# Patient Record
Sex: Female | Born: 1970 | Race: White | Hispanic: No | Marital: Married | Smoking: Never smoker
Health system: Southern US, Community
[De-identification: ages and names within clinical notes are randomized; demographics above are authoritative.]

## PROBLEM LIST (undated history)

## (undated) DIAGNOSIS — Z87442 Personal history of urinary calculi: Secondary | ICD-10-CM

## (undated) HISTORY — PX: NO PAST SURGERIES: SHX2092

---

## 1998-11-06 ENCOUNTER — Other Ambulatory Visit: Admission: RE | Admit: 1998-11-06 | Discharge: 1998-11-06 | Payer: Self-pay | Admitting: *Deleted

## 1999-11-08 ENCOUNTER — Other Ambulatory Visit: Admission: RE | Admit: 1999-11-08 | Discharge: 1999-11-08 | Payer: Self-pay | Admitting: *Deleted

## 2001-03-11 ENCOUNTER — Other Ambulatory Visit: Admission: RE | Admit: 2001-03-11 | Discharge: 2001-03-11 | Payer: Self-pay | Admitting: Obstetrics and Gynecology

## 2002-03-19 ENCOUNTER — Other Ambulatory Visit: Admission: RE | Admit: 2002-03-19 | Discharge: 2002-03-19 | Payer: Self-pay | Admitting: Obstetrics and Gynecology

## 2003-03-31 ENCOUNTER — Other Ambulatory Visit: Admission: RE | Admit: 2003-03-31 | Discharge: 2003-03-31 | Payer: Self-pay | Admitting: Obstetrics and Gynecology

## 2004-07-23 ENCOUNTER — Other Ambulatory Visit: Admission: RE | Admit: 2004-07-23 | Discharge: 2004-07-23 | Payer: Self-pay | Admitting: Obstetrics and Gynecology

## 2016-06-26 DIAGNOSIS — M9901 Segmental and somatic dysfunction of cervical region: Secondary | ICD-10-CM | POA: Diagnosis not present

## 2016-06-26 DIAGNOSIS — M9903 Segmental and somatic dysfunction of lumbar region: Secondary | ICD-10-CM | POA: Diagnosis not present

## 2016-06-26 DIAGNOSIS — M9902 Segmental and somatic dysfunction of thoracic region: Secondary | ICD-10-CM | POA: Diagnosis not present

## 2016-07-16 DIAGNOSIS — M9903 Segmental and somatic dysfunction of lumbar region: Secondary | ICD-10-CM | POA: Diagnosis not present

## 2016-07-16 DIAGNOSIS — M9901 Segmental and somatic dysfunction of cervical region: Secondary | ICD-10-CM | POA: Diagnosis not present

## 2016-07-16 DIAGNOSIS — M9902 Segmental and somatic dysfunction of thoracic region: Secondary | ICD-10-CM | POA: Diagnosis not present

## 2016-08-02 DIAGNOSIS — A084 Viral intestinal infection, unspecified: Secondary | ICD-10-CM | POA: Diagnosis not present

## 2016-08-13 DIAGNOSIS — M9903 Segmental and somatic dysfunction of lumbar region: Secondary | ICD-10-CM | POA: Diagnosis not present

## 2016-08-13 DIAGNOSIS — M9901 Segmental and somatic dysfunction of cervical region: Secondary | ICD-10-CM | POA: Diagnosis not present

## 2016-08-13 DIAGNOSIS — M9902 Segmental and somatic dysfunction of thoracic region: Secondary | ICD-10-CM | POA: Diagnosis not present

## 2016-08-30 DIAGNOSIS — M9901 Segmental and somatic dysfunction of cervical region: Secondary | ICD-10-CM | POA: Diagnosis not present

## 2016-08-30 DIAGNOSIS — M9902 Segmental and somatic dysfunction of thoracic region: Secondary | ICD-10-CM | POA: Diagnosis not present

## 2016-08-30 DIAGNOSIS — M9903 Segmental and somatic dysfunction of lumbar region: Secondary | ICD-10-CM | POA: Diagnosis not present

## 2016-09-13 DIAGNOSIS — M9902 Segmental and somatic dysfunction of thoracic region: Secondary | ICD-10-CM | POA: Diagnosis not present

## 2016-09-13 DIAGNOSIS — M9903 Segmental and somatic dysfunction of lumbar region: Secondary | ICD-10-CM | POA: Diagnosis not present

## 2016-09-13 DIAGNOSIS — M9901 Segmental and somatic dysfunction of cervical region: Secondary | ICD-10-CM | POA: Diagnosis not present

## 2016-09-27 DIAGNOSIS — M9902 Segmental and somatic dysfunction of thoracic region: Secondary | ICD-10-CM | POA: Diagnosis not present

## 2016-09-27 DIAGNOSIS — M9903 Segmental and somatic dysfunction of lumbar region: Secondary | ICD-10-CM | POA: Diagnosis not present

## 2016-09-27 DIAGNOSIS — M9901 Segmental and somatic dysfunction of cervical region: Secondary | ICD-10-CM | POA: Diagnosis not present

## 2017-02-01 DIAGNOSIS — H00015 Hordeolum externum left lower eyelid: Secondary | ICD-10-CM | POA: Diagnosis not present

## 2017-02-12 DIAGNOSIS — H0015 Chalazion left lower eyelid: Secondary | ICD-10-CM | POA: Diagnosis not present

## 2017-02-13 DIAGNOSIS — M9902 Segmental and somatic dysfunction of thoracic region: Secondary | ICD-10-CM | POA: Diagnosis not present

## 2017-02-13 DIAGNOSIS — M9903 Segmental and somatic dysfunction of lumbar region: Secondary | ICD-10-CM | POA: Diagnosis not present

## 2017-02-13 DIAGNOSIS — M9901 Segmental and somatic dysfunction of cervical region: Secondary | ICD-10-CM | POA: Diagnosis not present

## 2017-02-19 DIAGNOSIS — M9901 Segmental and somatic dysfunction of cervical region: Secondary | ICD-10-CM | POA: Diagnosis not present

## 2017-02-19 DIAGNOSIS — M9902 Segmental and somatic dysfunction of thoracic region: Secondary | ICD-10-CM | POA: Diagnosis not present

## 2017-02-19 DIAGNOSIS — M9903 Segmental and somatic dysfunction of lumbar region: Secondary | ICD-10-CM | POA: Diagnosis not present

## 2017-02-26 DIAGNOSIS — M9901 Segmental and somatic dysfunction of cervical region: Secondary | ICD-10-CM | POA: Diagnosis not present

## 2017-02-26 DIAGNOSIS — M9902 Segmental and somatic dysfunction of thoracic region: Secondary | ICD-10-CM | POA: Diagnosis not present

## 2017-02-26 DIAGNOSIS — M9903 Segmental and somatic dysfunction of lumbar region: Secondary | ICD-10-CM | POA: Diagnosis not present

## 2017-03-05 DIAGNOSIS — M9902 Segmental and somatic dysfunction of thoracic region: Secondary | ICD-10-CM | POA: Diagnosis not present

## 2017-03-05 DIAGNOSIS — M9903 Segmental and somatic dysfunction of lumbar region: Secondary | ICD-10-CM | POA: Diagnosis not present

## 2017-03-05 DIAGNOSIS — M9901 Segmental and somatic dysfunction of cervical region: Secondary | ICD-10-CM | POA: Diagnosis not present

## 2017-03-18 DIAGNOSIS — M9901 Segmental and somatic dysfunction of cervical region: Secondary | ICD-10-CM | POA: Diagnosis not present

## 2017-03-18 DIAGNOSIS — M9902 Segmental and somatic dysfunction of thoracic region: Secondary | ICD-10-CM | POA: Diagnosis not present

## 2017-03-18 DIAGNOSIS — M9903 Segmental and somatic dysfunction of lumbar region: Secondary | ICD-10-CM | POA: Diagnosis not present

## 2017-03-24 DIAGNOSIS — M7989 Other specified soft tissue disorders: Secondary | ICD-10-CM | POA: Diagnosis not present

## 2017-03-24 DIAGNOSIS — M79671 Pain in right foot: Secondary | ICD-10-CM | POA: Diagnosis not present

## 2017-04-02 DIAGNOSIS — M9903 Segmental and somatic dysfunction of lumbar region: Secondary | ICD-10-CM | POA: Diagnosis not present

## 2017-04-02 DIAGNOSIS — M9902 Segmental and somatic dysfunction of thoracic region: Secondary | ICD-10-CM | POA: Diagnosis not present

## 2017-04-02 DIAGNOSIS — M9901 Segmental and somatic dysfunction of cervical region: Secondary | ICD-10-CM | POA: Diagnosis not present

## 2017-04-17 DIAGNOSIS — M9901 Segmental and somatic dysfunction of cervical region: Secondary | ICD-10-CM | POA: Diagnosis not present

## 2017-04-17 DIAGNOSIS — M9902 Segmental and somatic dysfunction of thoracic region: Secondary | ICD-10-CM | POA: Diagnosis not present

## 2017-04-17 DIAGNOSIS — M9903 Segmental and somatic dysfunction of lumbar region: Secondary | ICD-10-CM | POA: Diagnosis not present

## 2017-05-01 DIAGNOSIS — M9903 Segmental and somatic dysfunction of lumbar region: Secondary | ICD-10-CM | POA: Diagnosis not present

## 2017-05-01 DIAGNOSIS — M9902 Segmental and somatic dysfunction of thoracic region: Secondary | ICD-10-CM | POA: Diagnosis not present

## 2017-05-01 DIAGNOSIS — M9901 Segmental and somatic dysfunction of cervical region: Secondary | ICD-10-CM | POA: Diagnosis not present

## 2017-05-22 DIAGNOSIS — M9901 Segmental and somatic dysfunction of cervical region: Secondary | ICD-10-CM | POA: Diagnosis not present

## 2017-05-22 DIAGNOSIS — M9903 Segmental and somatic dysfunction of lumbar region: Secondary | ICD-10-CM | POA: Diagnosis not present

## 2017-05-22 DIAGNOSIS — M9902 Segmental and somatic dysfunction of thoracic region: Secondary | ICD-10-CM | POA: Diagnosis not present

## 2017-05-31 DIAGNOSIS — R42 Dizziness and giddiness: Secondary | ICD-10-CM | POA: Diagnosis not present

## 2017-05-31 DIAGNOSIS — R600 Localized edema: Secondary | ICD-10-CM | POA: Diagnosis not present

## 2017-06-11 DIAGNOSIS — M9901 Segmental and somatic dysfunction of cervical region: Secondary | ICD-10-CM | POA: Diagnosis not present

## 2017-06-11 DIAGNOSIS — M9903 Segmental and somatic dysfunction of lumbar region: Secondary | ICD-10-CM | POA: Diagnosis not present

## 2017-06-11 DIAGNOSIS — M9902 Segmental and somatic dysfunction of thoracic region: Secondary | ICD-10-CM | POA: Diagnosis not present

## 2017-06-19 DIAGNOSIS — Z01419 Encounter for gynecological examination (general) (routine) without abnormal findings: Secondary | ICD-10-CM | POA: Diagnosis not present

## 2017-06-25 ENCOUNTER — Other Ambulatory Visit: Payer: Self-pay | Admitting: Obstetrics and Gynecology

## 2017-06-25 DIAGNOSIS — R928 Other abnormal and inconclusive findings on diagnostic imaging of breast: Secondary | ICD-10-CM

## 2017-06-30 ENCOUNTER — Ambulatory Visit
Admission: RE | Admit: 2017-06-30 | Discharge: 2017-06-30 | Disposition: A | Discharge status: Home / Self Care | Payer: 59 | Source: Ambulatory Visit | Attending: Obstetrics and Gynecology | Admitting: Obstetrics and Gynecology

## 2017-06-30 DIAGNOSIS — R928 Other abnormal and inconclusive findings on diagnostic imaging of breast: Secondary | ICD-10-CM

## 2017-06-30 DIAGNOSIS — N6321 Unspecified lump in the left breast, upper outer quadrant: Secondary | ICD-10-CM | POA: Diagnosis not present

## 2017-07-05 DIAGNOSIS — N390 Urinary tract infection, site not specified: Secondary | ICD-10-CM | POA: Diagnosis not present

## 2017-08-12 DIAGNOSIS — J069 Acute upper respiratory infection, unspecified: Secondary | ICD-10-CM | POA: Diagnosis not present

## 2018-03-16 DIAGNOSIS — Z23 Encounter for immunization: Secondary | ICD-10-CM | POA: Diagnosis not present

## 2018-03-20 DIAGNOSIS — J019 Acute sinusitis, unspecified: Secondary | ICD-10-CM | POA: Diagnosis not present

## 2018-03-20 DIAGNOSIS — Z6833 Body mass index (BMI) 33.0-33.9, adult: Secondary | ICD-10-CM | POA: Diagnosis not present

## 2018-03-20 DIAGNOSIS — J208 Acute bronchitis due to other specified organisms: Secondary | ICD-10-CM | POA: Diagnosis not present

## 2018-07-07 DIAGNOSIS — Z803 Family history of malignant neoplasm of breast: Secondary | ICD-10-CM | POA: Diagnosis not present

## 2018-07-07 DIAGNOSIS — Z6835 Body mass index (BMI) 35.0-35.9, adult: Secondary | ICD-10-CM | POA: Diagnosis not present

## 2018-07-07 DIAGNOSIS — Z01419 Encounter for gynecological examination (general) (routine) without abnormal findings: Secondary | ICD-10-CM | POA: Diagnosis not present

## 2018-07-07 DIAGNOSIS — Z808 Family history of malignant neoplasm of other organs or systems: Secondary | ICD-10-CM | POA: Diagnosis not present

## 2018-08-24 DIAGNOSIS — Z809 Family history of malignant neoplasm, unspecified: Secondary | ICD-10-CM | POA: Diagnosis not present

## 2018-08-24 DIAGNOSIS — Z9189 Other specified personal risk factors, not elsewhere classified: Secondary | ICD-10-CM | POA: Diagnosis not present

## 2018-08-29 DIAGNOSIS — N390 Urinary tract infection, site not specified: Secondary | ICD-10-CM | POA: Diagnosis not present

## 2018-08-29 DIAGNOSIS — R300 Dysuria: Secondary | ICD-10-CM | POA: Diagnosis not present

## 2018-08-29 DIAGNOSIS — Z6837 Body mass index (BMI) 37.0-37.9, adult: Secondary | ICD-10-CM | POA: Diagnosis not present

## 2018-09-01 ENCOUNTER — Other Ambulatory Visit: Payer: Self-pay | Admitting: Obstetrics and Gynecology

## 2018-09-01 DIAGNOSIS — Z9189 Other specified personal risk factors, not elsewhere classified: Secondary | ICD-10-CM

## 2018-11-23 DIAGNOSIS — R600 Localized edema: Secondary | ICD-10-CM | POA: Diagnosis not present

## 2018-11-23 DIAGNOSIS — J019 Acute sinusitis, unspecified: Secondary | ICD-10-CM | POA: Diagnosis not present

## 2018-11-23 DIAGNOSIS — B9689 Other specified bacterial agents as the cause of diseases classified elsewhere: Secondary | ICD-10-CM | POA: Diagnosis not present

## 2019-01-29 ENCOUNTER — Emergency Department (HOSPITAL_COMMUNITY): Payer: BC Managed Care – PPO

## 2019-01-29 ENCOUNTER — Emergency Department (HOSPITAL_COMMUNITY)
Admission: EM | Admit: 2019-01-29 | Discharge: 2019-01-29 | Disposition: A | Discharge status: Home / Self Care | Payer: BC Managed Care – PPO | Attending: Emergency Medicine | Admitting: Emergency Medicine

## 2019-01-29 ENCOUNTER — Other Ambulatory Visit: Payer: Self-pay

## 2019-01-29 ENCOUNTER — Encounter (HOSPITAL_COMMUNITY): Payer: Self-pay

## 2019-01-29 DIAGNOSIS — M549 Dorsalgia, unspecified: Secondary | ICD-10-CM | POA: Diagnosis not present

## 2019-01-29 DIAGNOSIS — R1084 Generalized abdominal pain: Secondary | ICD-10-CM | POA: Diagnosis not present

## 2019-01-29 DIAGNOSIS — R1030 Lower abdominal pain, unspecified: Secondary | ICD-10-CM | POA: Diagnosis present

## 2019-01-29 DIAGNOSIS — R110 Nausea: Secondary | ICD-10-CM | POA: Diagnosis not present

## 2019-01-29 DIAGNOSIS — N132 Hydronephrosis with renal and ureteral calculous obstruction: Secondary | ICD-10-CM | POA: Diagnosis not present

## 2019-01-29 DIAGNOSIS — K760 Fatty (change of) liver, not elsewhere classified: Secondary | ICD-10-CM | POA: Diagnosis not present

## 2019-01-29 DIAGNOSIS — N201 Calculus of ureter: Secondary | ICD-10-CM | POA: Diagnosis not present

## 2019-01-29 DIAGNOSIS — R319 Hematuria, unspecified: Secondary | ICD-10-CM

## 2019-01-29 DIAGNOSIS — R52 Pain, unspecified: Secondary | ICD-10-CM | POA: Diagnosis not present

## 2019-01-29 LAB — COMPREHENSIVE METABOLIC PANEL
ALT: 19 U/L (ref 0–44)
AST: 19 U/L (ref 15–41)
Albumin: 3.6 g/dL (ref 3.5–5.0)
Alkaline Phosphatase: 67 U/L (ref 38–126)
Anion gap: 14 (ref 5–15)
BUN: 17 mg/dL (ref 6–20)
CO2: 24 mmol/L (ref 22–32)
Calcium: 9.1 mg/dL (ref 8.9–10.3)
Chloride: 102 mmol/L (ref 98–111)
Creatinine, Ser: 0.96 mg/dL (ref 0.44–1.00)
GFR calc Af Amer: >60 mL/min (ref >60)
GFR calc non Af Amer: >60 mL/min (ref >60)
Glucose, Bld: 149 mg/dL — ABNORMAL HIGH (ref 70–99)
Potassium: 3.5 mmol/L (ref 3.5–5.1)
Sodium: 140 mmol/L (ref 135–145)
Total Bilirubin: 0.5 mg/dL (ref 0.3–1.2)
Total Protein: 7.2 g/dL (ref 6.5–8.1)

## 2019-01-29 LAB — CBC WITH DIFFERENTIAL/PLATELET
Abs Immature Granulocytes: 0.09 10*3/uL — ABNORMAL HIGH (ref 0.00–0.07)
Basophils Absolute: 0.1 10*3/uL (ref 0.0–0.1)
Basophils Relative: 0 %
Eosinophils Absolute: 0.1 10*3/uL (ref 0.0–0.5)
Eosinophils Relative: 1 %
HCT: 40.3 % (ref 36.0–46.0)
Hemoglobin: 12.9 g/dL (ref 12.0–15.0)
Immature Granulocytes: 1 %
Lymphocytes Relative: 13 %
Lymphs Abs: 2.2 10*3/uL (ref 0.7–4.0)
MCH: 27.4 pg (ref 26.0–34.0)
MCHC: 32 g/dL (ref 30.0–36.0)
MCV: 85.6 fL (ref 80.0–100.0)
Monocytes Absolute: 0.8 10*3/uL (ref 0.1–1.0)
Monocytes Relative: 5 %
Neutro Abs: 12.9 10*3/uL — ABNORMAL HIGH (ref 1.7–7.7)
Neutrophils Relative %: 80 %
Platelets: 330 10*3/uL (ref 150–400)
RBC: 4.71 MIL/uL (ref 3.87–5.11)
RDW: 15.1 % (ref 11.5–15.5)
WBC: 16.1 10*3/uL — ABNORMAL HIGH (ref 4.0–10.5)
nRBC: 0 % (ref 0.0–0.2)

## 2019-01-29 LAB — URINALYSIS, ROUTINE W REFLEX MICROSCOPIC
Bilirubin Urine: NEGATIVE
Glucose, UA: NEGATIVE mg/dL
Ketones, ur: 5 mg/dL — AB
Leukocytes,Ua: NEGATIVE
Nitrite: NEGATIVE
Protein, ur: NEGATIVE mg/dL
Specific Gravity, Urine: 1.016 (ref 1.005–1.030)
pH: 6 (ref 5.0–8.0)

## 2019-01-29 LAB — PREGNANCY, URINE: Preg Test, Ur: NEGATIVE

## 2019-01-29 LAB — LIPASE, BLOOD: Lipase: 22 U/L (ref 11–51)

## 2019-01-29 MED ORDER — KETOROLAC TROMETHAMINE 30 MG/ML IJ SOLN
30.0000 mg | Freq: Once | INTRAMUSCULAR | Status: AC
  Administered 2019-01-29: 06:00:00 30 mg via INTRAVENOUS
  Filled 2019-01-29: qty 1

## 2019-01-29 MED ORDER — ONDANSETRON HCL 4 MG/2ML IJ SOLN
4.0000 mg | Freq: Once | INTRAMUSCULAR | Status: AC
  Administered 2019-01-29: 4 mg via INTRAVENOUS
  Filled 2019-01-29: qty 2

## 2019-01-29 MED ORDER — TAMSULOSIN HCL 0.4 MG PO CAPS: 0.4000 mg | ORAL_CAPSULE | Freq: Every day | ORAL | 0 refills | Status: AC

## 2019-01-29 MED ORDER — HYDROMORPHONE HCL 1 MG/ML IJ SOLN
1.0000 mg | Freq: Once | INTRAMUSCULAR | Status: AC
  Administered 2019-01-29: 1 mg via INTRAVENOUS
  Filled 2019-01-29: qty 1

## 2019-01-29 MED ORDER — ONDANSETRON 4 MG PO TBDP: 4.0000 mg | ORAL_TABLET | Freq: Three times a day (TID) | ORAL | 0 refills | Status: DC | PRN

## 2019-01-29 MED ORDER — OXYCODONE-ACETAMINOPHEN 5-325 MG PO TABS: 1.0000 | ORAL_TABLET | ORAL | 0 refills | Status: DC | PRN

## 2019-01-29 NOTE — ED Triage Notes (Signed)
Pt BIB [facility] from home. Pt c/o right back, flank and abd pain, with N/V starting at 0100.

## 2019-01-29 NOTE — ED Notes (Signed)
BLUE & GREEN SAVE TUBE IN MAIN LAB

## 2019-01-29 NOTE — Discharge Instructions (Signed)
Take the prescribed medication as directed.  Try to make sure and stay well hydrated. Follow-up with urology-- call for appt. Return to the ED for new or worsening symptoms--  fever, uncontrolled pain, vomiting and can't tolerate medications, etc.

## 2019-01-29 NOTE — ED Provider Notes (Signed)
[facility]-EMERGENCY DEPT Provider Note   CSN: [id number] Arrival date & time: 01/29/19  [id number]     History   Chief Complaint Chief Complaint  Patient presents with  . Abdominal Pain  . Back Pain    HPI Tanya Dyer is a 48 y.o. female.     The history is provided by the patient and medical records.  Abdominal Pain Associated symptoms: nausea   Back Pain Associated symptoms: abdominal pain      48 year old female presenting to the ED with sudden onset of right flank pain at 1 AM.  Pain did not initially began in her suprapubic area but now more localized to the right flank.  Pain is sharp and stabbing with associated nausea but denies vomiting.  No difficulty urinating, dysuria, or hematuria.  No pelvic pain or vaginal discharge.  No history of similar in the past.  No meds taken prior to arrival.  History reviewed. No pertinent past medical history.  There are no active problems to display for this patient.   History reviewed. No pertinent surgical history.   OB History   No obstetric history on file.      Home Medications    Prior to Admission medications   Not on File    Family History Family History  Problem Relation Age of Onset  . Breast cancer Mother     Social History Social History   Tobacco Use  . Smoking status: Not on file  Substance Use Topics  . Alcohol use: Not on file  . Drug use: Not on file     Allergies   Penicillins   Review of Systems Review of Systems  Gastrointestinal: Positive for abdominal pain and nausea.  Musculoskeletal: Positive for back pain.  All other systems reviewed and are negative.    Physical Exam Updated Vital Signs BP (!) 163/103   Pulse (!) 58   Temp 98.2 F (36.8 C) (Oral)   Resp 18   Ht 5\' 7"  (1.702 m)   LMP 01/08/2019   SpO2 99%   Physical Exam Vitals signs and nursing note reviewed.  Constitutional:      Appearance: She is well-developed.     Comments: Appears  uncomfortable  HENT:     Head: Normocephalic and atraumatic.  Eyes:     Conjunctiva/sclera: Conjunctivae normal.     Pupils: Pupils are equal, round, and reactive to light.  Neck:     Musculoskeletal: Normal range of motion.  Cardiovascular:     Rate and Rhythm: Normal rate and regular rhythm.     Heart sounds: Normal heart sounds.  Pulmonary:     Effort: Pulmonary effort is normal.     Breath sounds: Normal breath sounds.  Abdominal:     General: Bowel sounds are normal.     Palpations: Abdomen is soft.     Tenderness: There is right CVA tenderness.  Musculoskeletal: Normal range of motion.  Skin:    General: Skin is warm and dry.  Neurological:     Mental Status: She is alert and oriented to person, place, and time.      ED Treatments / Results  Labs (all labs ordered are listed, but only abnormal results are displayed) Labs Reviewed  CBC WITH DIFFERENTIAL/PLATELET - Abnormal; Notable for the following components:      Result Value   WBC 16.1 (*)    Neutro Abs 12.9 (*)    Abs Immature Granulocytes 0.09 (*)    All other  components within normal limits  COMPREHENSIVE METABOLIC PANEL - Abnormal; Notable for the following components:   Glucose, Bld 149 (*)    All other components within normal limits  URINALYSIS, ROUTINE W REFLEX MICROSCOPIC - Abnormal; Notable for the following components:   APPearance HAZY (*)    Hgb urine dipstick MODERATE (*)    Ketones, ur 5 (*)    Bacteria, UA FEW (*)    All other components within normal limits  LIPASE, BLOOD  PREGNANCY, URINE    EKG None  Radiology Ct Renal Stone Study  Result Date: 01/29/2019 CLINICAL DATA:  Right flank pain with stone disease suspected EXAM: CT ABDOMEN AND PELVIS WITHOUT CONTRAST TECHNIQUE: Multidetector CT imaging of the abdomen and pelvis was performed following the standard protocol without IV contrast. COMPARISON:  None. FINDINGS: Lower chest:  No contributory findings. Hepatobiliary: Hepatic  steatosis.No evidence of biliary obstruction or stone. Pancreas: Unremarkable. Spleen: Unremarkable. Adrenals/Urinary Tract: Negative adrenals. 5 mm right UPJ calculus with mild hydronephrosis and perinephric stranding. No left hydronephrosis. Numerous calculi on both sides, numbering at least 10 on the right. The largest stone is at the left lower pole measuring 4 mm. Left renal cystic density. Unremarkable bladder. Stomach/Bowel:  No obstruction. No appendicitis. Vascular/Lymphatic: No acute vascular abnormality. No mass or adenopathy. Reproductive:Low-density in the right ovary, presumed follicle based on size. Other: No ascites or pneumoperitoneum. Musculoskeletal: No acute abnormalities. IMPRESSION: 1. 5 mm right UPJ calculus with mild hydronephrosis. 2. Multiple bilateral renal calculi. 3. Hepatic steatosis. Electronically Signed   By: Marnee SpringJonathon  Watts M.D.   On: 01/29/2019 06:55    Procedures Procedures (including critical care time)  Medications Ordered in ED Medications  HYDROmorphone (DILAUDID) injection 1 mg (1 mg Intravenous Given 01/29/19 0614)  ketorolac (TORADOL) 30 MG/ML injection 30 mg (30 mg Intravenous Given 01/29/19 0614)  ondansetron (ZOFRAN) injection 4 mg (4 mg Intravenous Given 01/29/19 [id number])     Initial Impression / Assessment and Plan / ED Course  I have reviewed the triage vital signs and the nursing notes.  Pertinent labs & imaging results that were available during my care of the patient were reviewed by me and considered in my medical decision making (see chart for details).  48 year old female here with sudden onset right flank pain at 1 AM that woke her from sleep.  Pain is sharp in nature.  No associated urinary symptoms.  Does have some nausea but denies vomiting.  She is afebrile and nontoxic here but does appear uncomfortable.  Labs with leukocytosis, but otherwise reassuring.  UA with blood and few bacteria but negative leukocytes, negative nitrites.  Suspect kidney  stone.  Plan for CT renal study.  Pain control here.  6:58 AM CT with right 5mm UPJ calculus, mild hydro.  Patient appears much more comfortable after medications here.  She has not had any active emesis and vitals are stable.  Feel she is appropriate for discharge home with expectant management.  Will give urology follow-up.  Return here for any new or acute changes.  Final Clinical Impressions(s) / ED Diagnoses   Final diagnoses:  Right ureteral stone  Hematuria, unspecified type    ED Discharge Orders         Ordered    oxyCODONE-acetaminophen (PERCOCET) 5-325 MG tablet  Every 4 hours PRN     01/29/19 0704    ondansetron (ZOFRAN ODT) 4 MG disintegrating tablet  Every 8 hours PRN     01/29/19 0704    tamsulosin (FLOMAX) 0.4  MG CAPS capsule  Daily after supper     01/29/19 0704           Larene Pickett, PA-C 01/29/19 [id number]    Merrily Pew, MD 01/30/19 [phone]

## 2019-02-01 DIAGNOSIS — N202 Calculus of kidney with calculus of ureter: Secondary | ICD-10-CM | POA: Diagnosis not present

## 2019-02-02 ENCOUNTER — Encounter (HOSPITAL_COMMUNITY): Payer: Self-pay | Admitting: General Practice

## 2019-02-02 ENCOUNTER — Other Ambulatory Visit: Payer: Self-pay | Admitting: Urology

## 2019-02-03 NOTE — H&P (Signed)
Office Visit Report     02/01/2019   --------------------------------------------------------------------------------   Tanya Dyer  MRN: [id number]  DOB: 1970-07-30, 48 year old Female  SSN:    PRIMARY CARE:    REFERRING:    PROVIDER:  [name], M.D.  LOCATION:  [facility] [phone]     --------------------------------------------------------------------------------   CC: I have ureteral stone.  HPI: Tanya Dyer is a 48 year-old female patient who is here for ureteral stone.  The problem is on the right side. She first stated noticing pain on 01/29/2019. This is her first kidney stone. She is currently having flank pain. She denies having back pain, groin pain, nausea, vomiting, fever, and chills. Pain is occuring on the right side. She has not caught a stone in her urine strainer since her symptoms began.   She has never had surgical treatment for calculi in the past.   CT scan of the abdomen and pelvis revealed a 5 mm right proximal stone with some mild Hydro. It may have been visible on the scout. Skin to stone distance about 13 cm. There were bilateral stones largest in the left lower pole of 4 mm.   WBC was 16, cr 0.96. Urine with 21-50 rbc, -N, -LE.   She has not seen a stone pass. She tried oxycodone. That made her nauseous. UA is clear. KUB with right proximal stone. She taking tamsulosin.     ALLERGIES: penicillin    MEDICATIONS: Tamsulosin Hcl 0.4 mg capsule  Advil  Ondansetron Hcl 4 mg tablet  Oxycodone-Acetaminophen 5 mg-325 mg tablet     GU PSH: None   NON-GU PSH: None   GU PMH: None   NON-GU PMH: None   FAMILY HISTORY: Bone Cancer - Mother Breast Cancer - Mother Death of family member - Mother Kidney Stones - Father stroke - Father, Mother   SOCIAL HISTORY: Marital Status: Married Preferred Language: English; Ethnicity: Not Hispanic Or Latino; Race: White Current Smoking Status: Patient has never smoked.   Tobacco  Use Assessment Completed: Used Tobacco in last 30 days? Does not use smokeless tobacco. Has never drank.  Does not use drugs. Drinks 3 caffeinated drinks per day. Patient's occupation Electronics engineer.    REVIEW OF SYSTEMS:    GU Review Female:   Patient reports frequent urination and burning /pain with urination. Patient denies hard to postpone urination, get up at night to urinate, leakage of urine, stream starts and stops, trouble starting your stream, have to strain to urinate, and being pregnant.  Gastrointestinal (Upper):   Patient reports nausea and vomiting. Patient denies indigestion/ heartburn.  Gastrointestinal (Lower):   Patient denies diarrhea and constipation.  Constitutional:   Patient reports night sweats and fatigue. Patient denies fever and weight loss.  Skin:   Patient denies skin rash/ lesion and itching.  Eyes:   Patient denies blurred vision and double vision.  Ears/ Nose/ Throat:   Patient denies sinus problems and sore throat.  Hematologic/Lymphatic:   Patient denies swollen glands and easy bruising.  Cardiovascular:   Patient reports leg swelling. Patient denies chest pains.  Respiratory:   Patient denies cough and shortness of breath.  Endocrine:   Patient denies excessive thirst.  Musculoskeletal:   Patient reports back pain. Patient denies joint pain.  Neurological:   Patient reports dizziness. Patient denies headaches.  Psychologic:   Patient denies depression and anxiety.   VITAL SIGNS:      02/01/2019 01:46 PM  Weight 220 lb /  99.79 kg  Height 67 in / 170.18 cm  BP 168/98 mmHg  Pulse 69 /min  Temperature 98.4 F / 36.8 C  BMI 34.5 kg/m   MULTI-SYSTEM PHYSICAL EXAMINATION:    Constitutional: Well-nourished. No physical deformities. Normally developed. Good grooming.  Neck: Neck symmetrical, not swollen. Normal tracheal position.  Respiratory: No labored breathing, no use of accessory muscles.   Cardiovascular: Normal temperature,  normal extremity pulses, no swelling, no varicosities.  Skin: No paleness, no jaundice, no cyanosis. No lesion, no ulcer, no rash.  Neurologic / Psychiatric: Oriented to time, oriented to place, oriented to person. No depression, no anxiety, no agitation.  Gastrointestinal: No mass, no tenderness, no rigidity, non obese abdomen.  Eyes: Normal conjunctivae. Normal eyelids.  Ears, Nose, Mouth, and Throat: Left ear no scars, no lesions, no masses. Right ear no scars, no lesions, no masses. Nose no scars, no lesions, no masses. Normal hearing. Normal lips.  Musculoskeletal: Normal gait and station of head and neck.     PAST DATA REVIEWED:  Source Of History:  Patient  X-Ray Review: C.T. Abdomen/Pelvis: Reviewed Films. 01/2019    PROCEDURES:         KUB - 74018  A single view of the abdomen is obtained.  Calculi:  4 mm right proximal stone, scattered small bilateral stones . Phleboliths correlate to CT.       The bones appeared normal. The bowel gas pattern appeared normal. The soft tissues were unremarkable.  Patient confirmed No Neulasta OnPro Device.           Urinalysis Dipstick Dipstick Cont'd  Color: Yellow Bilirubin: Neg mg/dL  Appearance: Clear Ketones: Neg mg/dL  Specific Gravity: 1.6101.010 Blood: Neg ery/uL  pH: 6.0 Protein: Neg mg/dL  Glucose: Neg mg/dL Urobilinogen: 0.2 mg/dL    Nitrites: Neg    Leukocyte Esterase: Neg leu/uL         Phenergan 25mg  - J2550, [id number] patient tolerated well- husband will come to pick her up CAJ-CMA   Qty: 25 Adm. By: Samara Deistindy Jackson  Unit: mg Lot No [id number]  Route: IM Exp. Date 01/23/2020  Freq: None Mfgr.:   Site: Right Buttock   ASSESSMENT:      ICD-10 Details  1 GU:   Ureteral calculus - N20.1   2   Renal calculus - N20.0    PLAN:           Orders X-Rays: KUB          Schedule Return Visit/Planned Activity: Next Available Appointment - Schedule Surgery          Document Letter(s):  Created for Patient: Clinical Summary          Notes:   I discussed with the patient the nature risks and benefits of continued stone passage, off label use of alpha blockers, shockwave lithotripsy or ureteroscopy. All questions answered. Plan to set up for ESWL Thursday. She was given promethazine 25 mg IM today.      * Signed by Jerilee FieldMatthew Ottavio Norem, M.D. on 02/01/19 at 4:33 PM (EDT*     The information contained in this medical record document is considered private and confidential patient information. This information can only be used for the medical diagnosis and/or medical services that are being provided by the patient's selected caregivers. This information can only be distributed outside of the patient's care if the patient agrees and signs waivers of authorization for this information to be sent to an outside source or route.

## 2019-02-04 ENCOUNTER — Ambulatory Visit (HOSPITAL_COMMUNITY)
Admission: RE | Admit: 2019-02-04 | Discharge: 2019-02-04 | Disposition: A | Discharge status: Home / Self Care | Payer: BC Managed Care – PPO | Attending: Urology | Admitting: Urology

## 2019-02-04 ENCOUNTER — Encounter (HOSPITAL_COMMUNITY): Payer: Self-pay | Admitting: *Deleted

## 2019-02-04 ENCOUNTER — Encounter (HOSPITAL_COMMUNITY)
Admission: RE | Disposition: A | Discharge status: Home / Self Care | Payer: Self-pay | Source: Home / Self Care | Attending: Urology

## 2019-02-04 ENCOUNTER — Ambulatory Visit (HOSPITAL_COMMUNITY): Payer: BC Managed Care – PPO

## 2019-02-04 ENCOUNTER — Other Ambulatory Visit: Payer: Self-pay

## 2019-02-04 DIAGNOSIS — N132 Hydronephrosis with renal and ureteral calculous obstruction: Secondary | ICD-10-CM | POA: Diagnosis not present

## 2019-02-04 DIAGNOSIS — N201 Calculus of ureter: Secondary | ICD-10-CM

## 2019-02-04 DIAGNOSIS — Z01818 Encounter for other preprocedural examination: Secondary | ICD-10-CM | POA: Diagnosis not present

## 2019-02-04 DIAGNOSIS — N202 Calculus of kidney with calculus of ureter: Secondary | ICD-10-CM | POA: Diagnosis not present

## 2019-02-04 HISTORY — DX: Personal history of urinary calculi: Z87.442

## 2019-02-04 HISTORY — PX: EXTRACORPOREAL SHOCK WAVE LITHOTRIPSY: SHX1557

## 2019-02-04 SURGERY — LITHOTRIPSY, ESWL
Anesthesia: LOCAL | Laterality: Right

## 2019-02-04 MED ORDER — CIPROFLOXACIN HCL 500 MG PO TABS
500.0000 mg | ORAL_TABLET | ORAL | Status: AC
  Administered 2019-02-04: 500 mg via ORAL
  Filled 2019-02-04: qty 1

## 2019-02-04 MED ORDER — ONDANSETRON 4 MG PO TBDP: 4.0000 mg | ORAL_TABLET | Freq: Three times a day (TID) | ORAL | 0 refills | Status: AC | PRN

## 2019-02-04 MED ORDER — DIPHENHYDRAMINE HCL 25 MG PO CAPS
25.0000 mg | ORAL_CAPSULE | ORAL | Status: AC
  Administered 2019-02-04: 25 mg via ORAL
  Filled 2019-02-04: qty 1

## 2019-02-04 MED ORDER — SODIUM CHLORIDE 0.9 % IV SOLN
INTRAVENOUS | Status: DC
  Administered 2019-02-04: 08:00:00 via INTRAVENOUS

## 2019-02-04 MED ORDER — DIAZEPAM 5 MG PO TABS
10.0000 mg | ORAL_TABLET | ORAL | Status: AC
  Administered 2019-02-04: 10 mg via ORAL
  Filled 2019-02-04: qty 2

## 2019-02-04 MED ORDER — OXYCODONE-ACETAMINOPHEN 5-325 MG PO TABS: 1.0000 | ORAL_TABLET | ORAL | 0 refills | Status: AC | PRN

## 2019-02-04 NOTE — Op Note (Addendum)
Right 4 mm Stone - proximal   Right ESWL   Findings; stone smudged and fragments may pass. She may need a staged procedure. Please see [place] op note for more details. She tolerated the procedure well. She remained hypertensive. I spoke to [place] and he said she gets nervous around Dr's and hospitals and BP goes up. She has seen her PCP about her blood pressure and I recommended it again.

## 2019-02-04 NOTE — Interval H&P Note (Signed)
History and Physical Interval Note:  02/04/2019 8:55 AM  Tanya Dyer  has presented today for surgery, with the diagnosis of RIGHT PROXIMAL URETERAL STONE.  The various methods of treatment have been discussed with the patient and family. After consideration of risks, benefits and other options for treatment, the patient has consented to  Procedure(s): EXTRACORPOREAL SHOCK WAVE LITHOTRIPSY (ESWL) (Right) as a surgical intervention.  She is doing well . She has an episode of emesis last night. hasnt seen a stone pass. No fever or dysuria. Stone preent on KUB in right proximal ureter. The patient's history has been reviewed, patient examined, no change in status, stable for surgery.  I have reviewed the patient's chart and labs.  Questions were answered to the patient's satisfaction.     [name]

## 2019-02-04 NOTE — Discharge Instructions (Signed)

## 2019-02-05 ENCOUNTER — Encounter (HOSPITAL_COMMUNITY): Payer: Self-pay | Admitting: Urology

## 2019-02-18 DIAGNOSIS — N201 Calculus of ureter: Secondary | ICD-10-CM | POA: Diagnosis not present

## 2019-02-18 DIAGNOSIS — N200 Calculus of kidney: Secondary | ICD-10-CM | POA: Diagnosis not present

## 2019-04-23 DIAGNOSIS — J302 Other seasonal allergic rhinitis: Secondary | ICD-10-CM | POA: Diagnosis not present

## 2019-04-23 DIAGNOSIS — B9689 Other specified bacterial agents as the cause of diseases classified elsewhere: Secondary | ICD-10-CM | POA: Diagnosis not present

## 2019-04-23 DIAGNOSIS — J019 Acute sinusitis, unspecified: Secondary | ICD-10-CM | POA: Diagnosis not present

## 2019-05-13 DIAGNOSIS — N200 Calculus of kidney: Secondary | ICD-10-CM | POA: Diagnosis not present

## 2019-07-12 DIAGNOSIS — F418 Other specified anxiety disorders: Secondary | ICD-10-CM | POA: Diagnosis not present

## 2019-10-18 DIAGNOSIS — A084 Viral intestinal infection, unspecified: Secondary | ICD-10-CM | POA: Diagnosis not present

## 2019-11-17 DIAGNOSIS — Z6836 Body mass index (BMI) 36.0-36.9, adult: Secondary | ICD-10-CM | POA: Diagnosis not present

## 2019-11-17 DIAGNOSIS — Z01419 Encounter for gynecological examination (general) (routine) without abnormal findings: Secondary | ICD-10-CM | POA: Diagnosis not present

## 2019-11-17 DIAGNOSIS — Z1231 Encounter for screening mammogram for malignant neoplasm of breast: Secondary | ICD-10-CM | POA: Diagnosis not present

## 2019-11-30 IMAGING — US ULTRASOUND LEFT BREAST LIMITED
1 series · 6 of 6 positions shown · non-contrast
Comparison: Previous exam(s).

CLINICAL DATA: Screening recall for a possible left breast mass.

EXAM:
2D DIGITAL DIAGNOSTIC UNILATERAL LEFT MAMMOGRAM WITH CAD AND ADJUNCT
TOMO
LEFT BREAST ULTRASOUND

[Series 1: ultrasound left breast limited · 0.06mm/px · 6 of 6 slices shown]
[im 1/6]
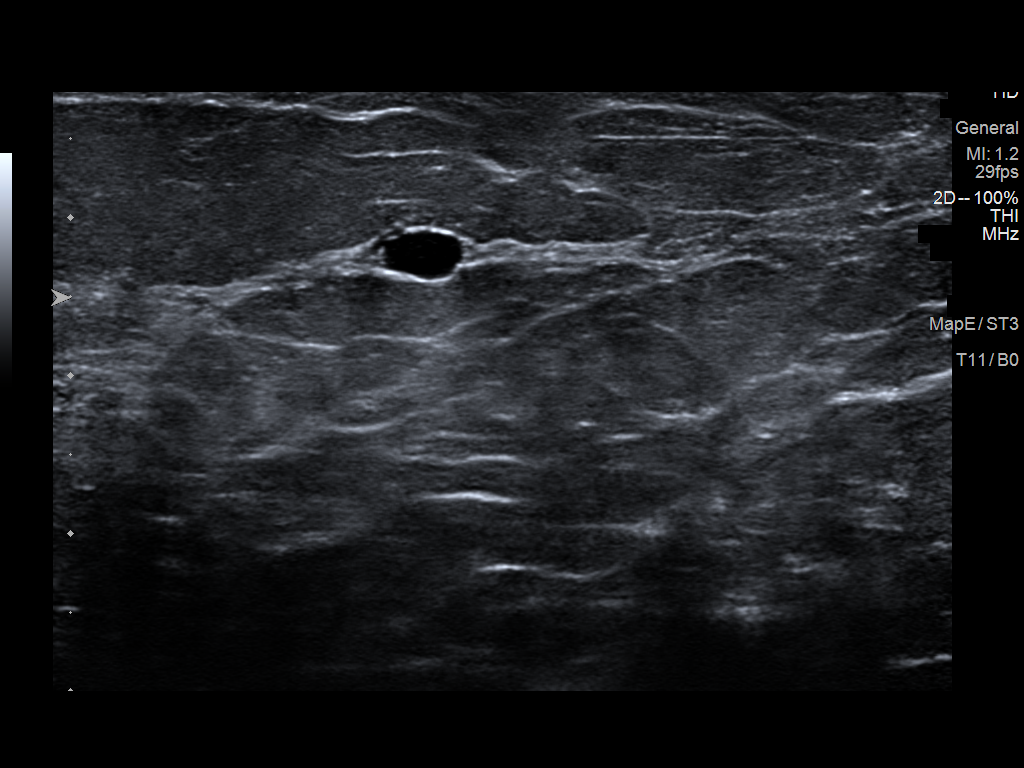
[im 2/6]
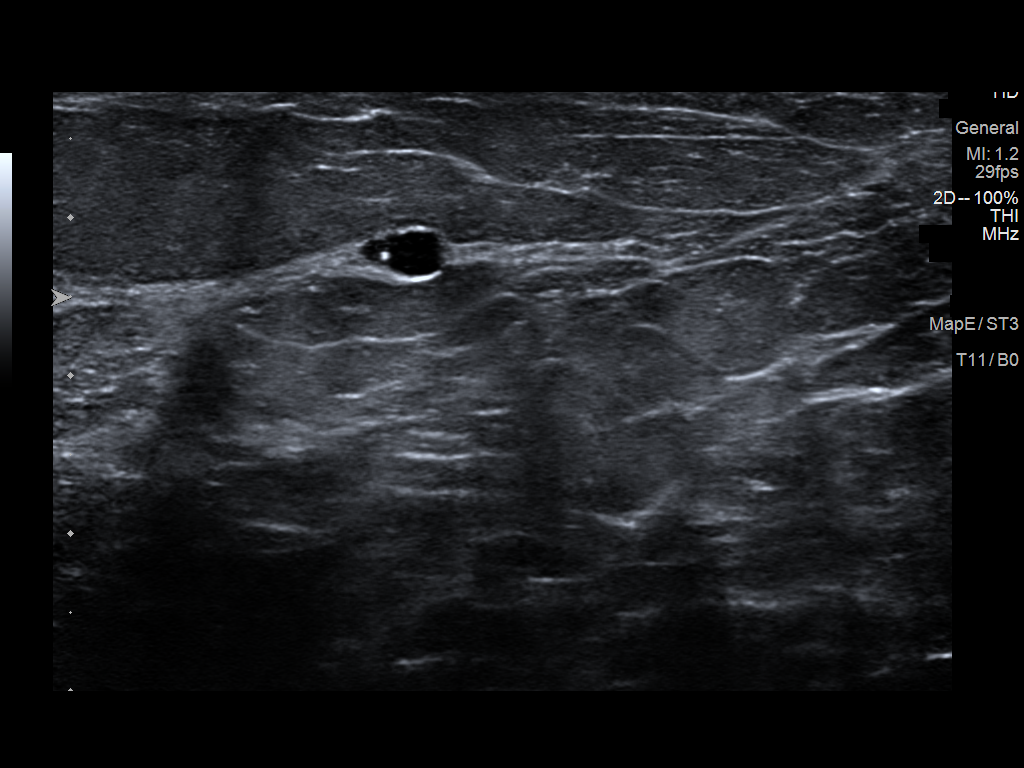
[im 3/6]
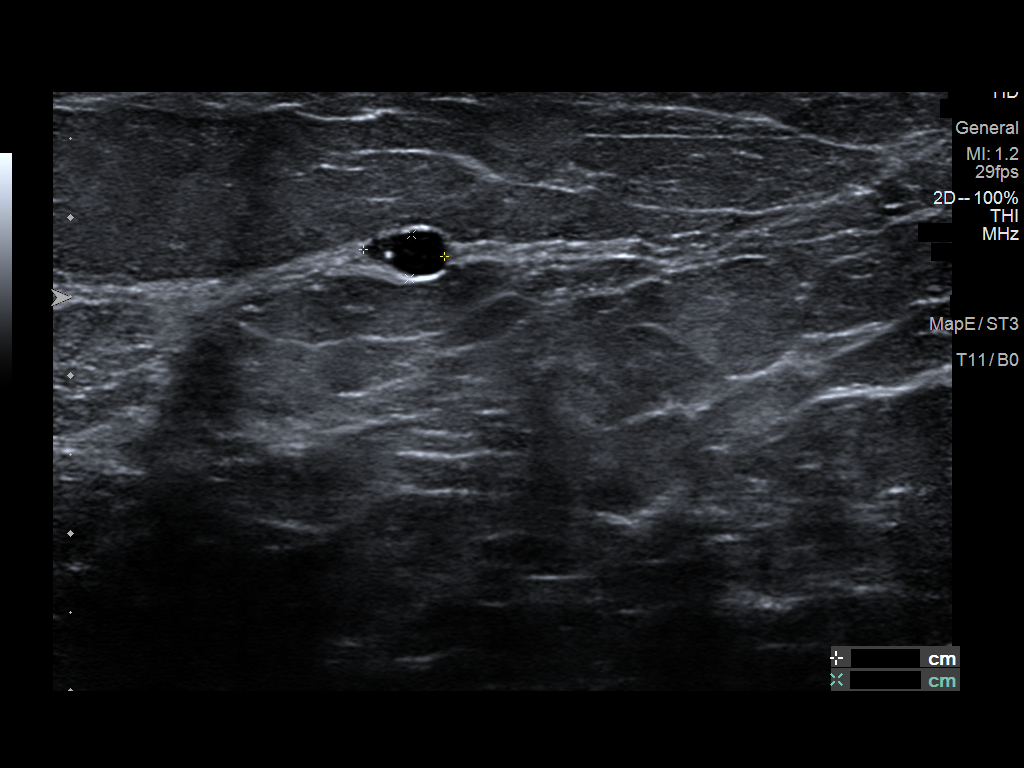
[im 4/6]
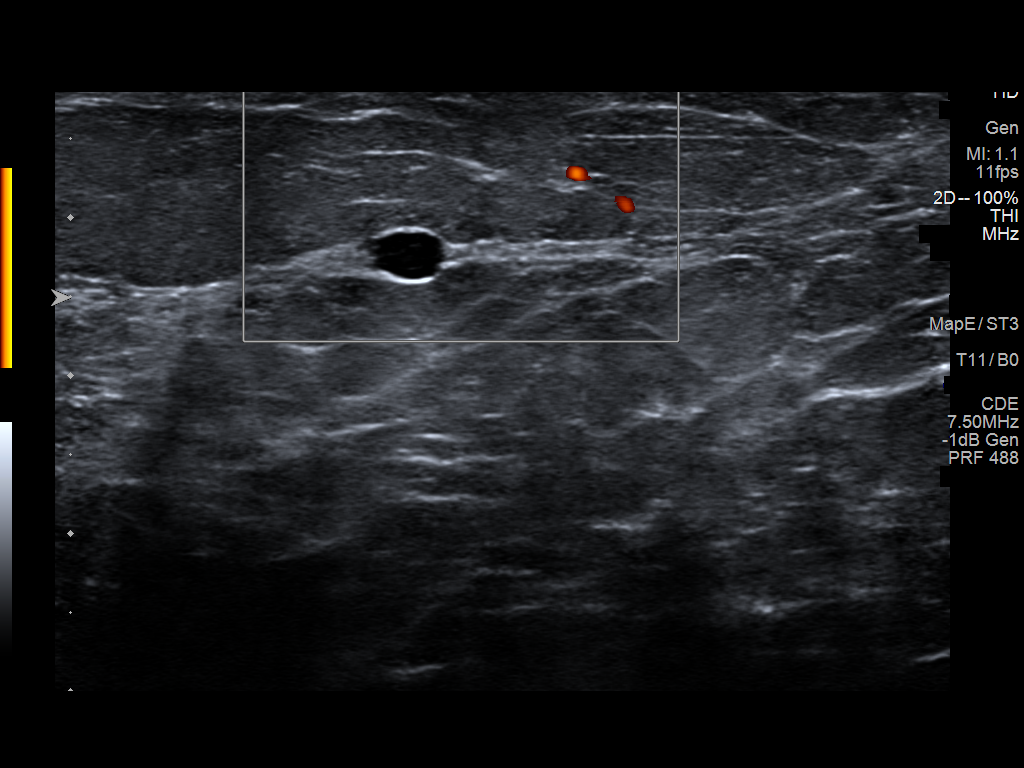
[im 5/6]
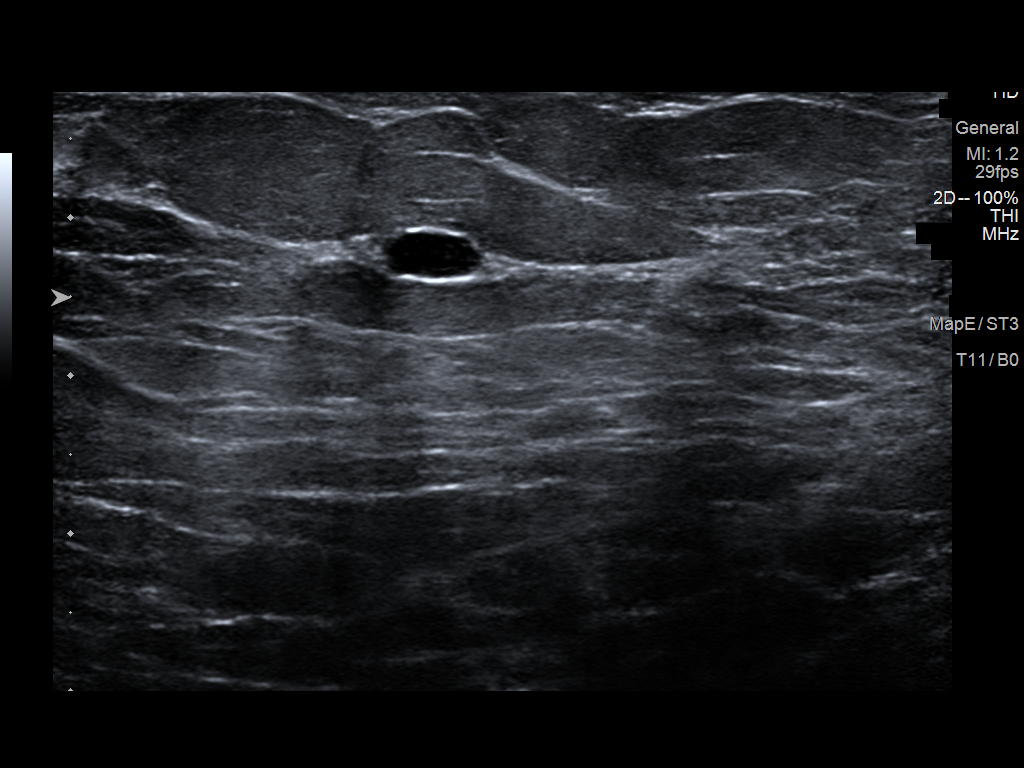
[im 6/6]
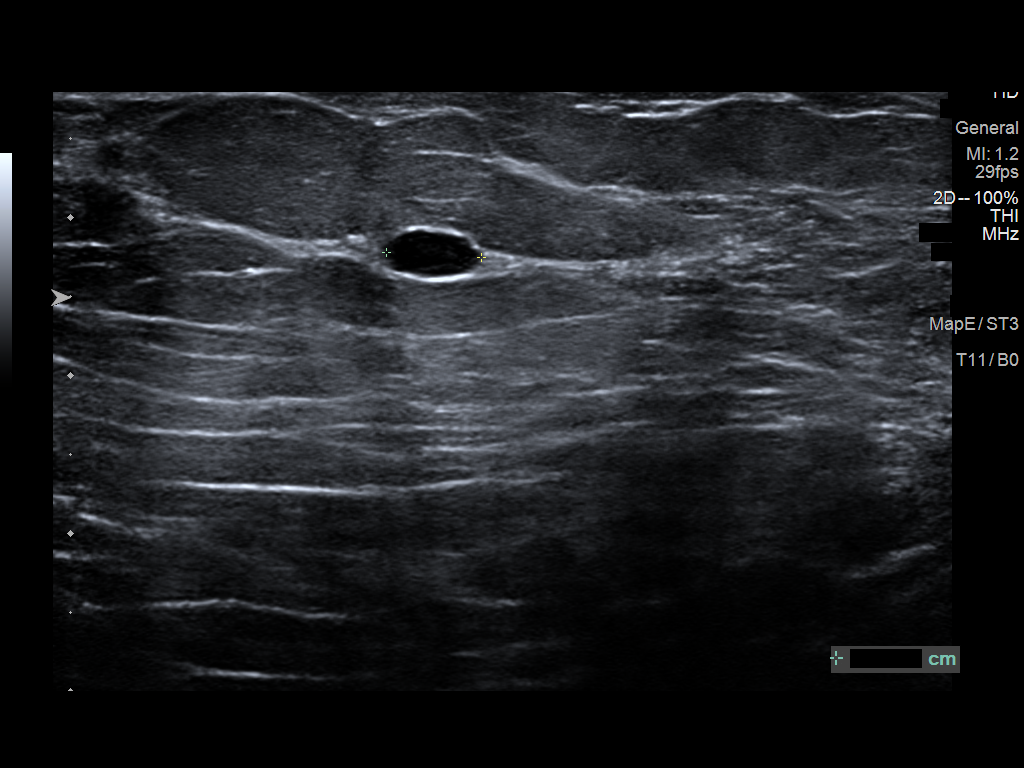

[6 of 6 positions shown; findings below may reference images not displayed]

ACR Breast Density Category b: There are scattered areas of
fibroglandular density.
FINDINGS: There is a persistent oval circumscribed mass in the lateral aspect
of the left breast on spot compression tomosynthesis imaging which
measures approximately 6 mm.

Mammographic images were processed with CAD.

Ultrasound targeted to the left breast at 2 o'clock, 3 cm from the
nipple demonstrates an oval anechoic circumscribed mass measuring 6
x 3 x 5 mm.
IMPRESSION: The mass of concern in the lateral left breast corresponds with a
benign cyst.

RECOMMENDATION:
Screening mammogram in one year.(Code:XY-3-L4S)

I have discussed the findings and recommendations with the patient.
Results were also provided in writing at the conclusion of the
visit. If applicable, a reminder letter will be sent to the patient
regarding the next appointment.

BI-RADS CATEGORY  2: Benign.

## 2020-01-25 DIAGNOSIS — R42 Dizziness and giddiness: Secondary | ICD-10-CM | POA: Diagnosis not present

## 2020-01-25 DIAGNOSIS — R5382 Chronic fatigue, unspecified: Secondary | ICD-10-CM | POA: Diagnosis not present

## 2020-05-31 DIAGNOSIS — N201 Calculus of ureter: Secondary | ICD-10-CM | POA: Diagnosis not present

## 2020-05-31 DIAGNOSIS — N390 Urinary tract infection, site not specified: Secondary | ICD-10-CM | POA: Diagnosis not present

## 2020-05-31 DIAGNOSIS — B9620 Unspecified Escherichia coli [E. coli] as the cause of diseases classified elsewhere: Secondary | ICD-10-CM | POA: Diagnosis not present

## 2020-05-31 DIAGNOSIS — M545 Low back pain, unspecified: Secondary | ICD-10-CM | POA: Diagnosis not present

## 2020-05-31 DIAGNOSIS — R109 Unspecified abdominal pain: Secondary | ICD-10-CM | POA: Diagnosis not present

## 2020-06-15 DIAGNOSIS — N3000 Acute cystitis without hematuria: Secondary | ICD-10-CM | POA: Diagnosis not present

## 2020-06-15 DIAGNOSIS — N202 Calculus of kidney with calculus of ureter: Secondary | ICD-10-CM | POA: Diagnosis not present

## 2020-08-04 DIAGNOSIS — R6889 Other general symptoms and signs: Secondary | ICD-10-CM | POA: Diagnosis not present

## 2020-08-04 DIAGNOSIS — Z20822 Contact with and (suspected) exposure to covid-19: Secondary | ICD-10-CM | POA: Diagnosis not present

## 2020-08-07 DIAGNOSIS — Z6841 Body Mass Index (BMI) 40.0 and over, adult: Secondary | ICD-10-CM | POA: Diagnosis not present

## 2020-08-07 DIAGNOSIS — B9689 Other specified bacterial agents as the cause of diseases classified elsewhere: Secondary | ICD-10-CM | POA: Diagnosis not present

## 2020-08-07 DIAGNOSIS — R059 Cough, unspecified: Secondary | ICD-10-CM | POA: Diagnosis not present

## 2020-08-07 DIAGNOSIS — J208 Acute bronchitis due to other specified organisms: Secondary | ICD-10-CM | POA: Diagnosis not present

## 2020-09-26 DIAGNOSIS — H8113 Benign paroxysmal vertigo, bilateral: Secondary | ICD-10-CM | POA: Diagnosis not present

## 2020-11-23 DIAGNOSIS — Z13228 Encounter for screening for other metabolic disorders: Secondary | ICD-10-CM | POA: Diagnosis not present

## 2020-11-23 DIAGNOSIS — Z6839 Body mass index (BMI) 39.0-39.9, adult: Secondary | ICD-10-CM | POA: Diagnosis not present

## 2020-11-23 DIAGNOSIS — Z13220 Encounter for screening for lipoid disorders: Secondary | ICD-10-CM | POA: Diagnosis not present

## 2020-11-23 DIAGNOSIS — Z1231 Encounter for screening mammogram for malignant neoplasm of breast: Secondary | ICD-10-CM | POA: Diagnosis not present

## 2020-11-23 DIAGNOSIS — Z01419 Encounter for gynecological examination (general) (routine) without abnormal findings: Secondary | ICD-10-CM | POA: Diagnosis not present

## 2020-11-28 ENCOUNTER — Other Ambulatory Visit: Payer: Self-pay | Admitting: Obstetrics and Gynecology

## 2020-11-28 DIAGNOSIS — R928 Other abnormal and inconclusive findings on diagnostic imaging of breast: Secondary | ICD-10-CM

## 2020-12-07 DIAGNOSIS — Z1211 Encounter for screening for malignant neoplasm of colon: Secondary | ICD-10-CM | POA: Diagnosis not present

## 2020-12-08 ENCOUNTER — Other Ambulatory Visit: Payer: Self-pay | Admitting: Obstetrics and Gynecology

## 2020-12-08 ENCOUNTER — Ambulatory Visit
Admission: RE | Admit: 2020-12-08 | Discharge: 2020-12-08 | Disposition: A | Discharge status: Home / Self Care | Payer: BC Managed Care – PPO | Source: Ambulatory Visit | Attending: Obstetrics and Gynecology | Admitting: Obstetrics and Gynecology

## 2020-12-08 ENCOUNTER — Other Ambulatory Visit: Payer: Self-pay

## 2020-12-08 DIAGNOSIS — R928 Other abnormal and inconclusive findings on diagnostic imaging of breast: Secondary | ICD-10-CM

## 2020-12-08 DIAGNOSIS — N6012 Diffuse cystic mastopathy of left breast: Secondary | ICD-10-CM | POA: Diagnosis not present

## 2020-12-08 DIAGNOSIS — R922 Inconclusive mammogram: Secondary | ICD-10-CM | POA: Diagnosis not present

## 2020-12-08 DIAGNOSIS — N6011 Diffuse cystic mastopathy of right breast: Secondary | ICD-10-CM | POA: Diagnosis not present

## 2020-12-14 LAB — COLOGUARD: COLOGUARD: NEGATIVE

## 2020-12-18 ENCOUNTER — Other Ambulatory Visit: Payer: BC Managed Care – PPO

## 2021-01-27 DIAGNOSIS — E669 Obesity, unspecified: Secondary | ICD-10-CM | POA: Diagnosis not present

## 2021-01-27 DIAGNOSIS — Z1211 Encounter for screening for malignant neoplasm of colon: Secondary | ICD-10-CM | POA: Diagnosis not present

## 2021-01-27 DIAGNOSIS — Z1331 Encounter for screening for depression: Secondary | ICD-10-CM | POA: Diagnosis not present

## 2021-01-27 DIAGNOSIS — R7401 Elevation of levels of liver transaminase levels: Secondary | ICD-10-CM | POA: Diagnosis not present

## 2021-02-21 DIAGNOSIS — K760 Fatty (change of) liver, not elsewhere classified: Secondary | ICD-10-CM | POA: Diagnosis not present

## 2021-02-21 DIAGNOSIS — R7401 Elevation of levels of liver transaminase levels: Secondary | ICD-10-CM | POA: Diagnosis not present

## 2021-02-21 DIAGNOSIS — R945 Abnormal results of liver function studies: Secondary | ICD-10-CM | POA: Diagnosis not present

## 2021-05-09 DIAGNOSIS — N3289 Other specified disorders of bladder: Secondary | ICD-10-CM | POA: Diagnosis not present

## 2021-05-09 DIAGNOSIS — K760 Fatty (change of) liver, not elsewhere classified: Secondary | ICD-10-CM | POA: Diagnosis not present

## 2021-05-09 DIAGNOSIS — R319 Hematuria, unspecified: Secondary | ICD-10-CM | POA: Diagnosis not present

## 2021-05-09 DIAGNOSIS — K6389 Other specified diseases of intestine: Secondary | ICD-10-CM | POA: Diagnosis not present

## 2021-05-09 DIAGNOSIS — R109 Unspecified abdominal pain: Secondary | ICD-10-CM | POA: Diagnosis not present

## 2021-05-09 DIAGNOSIS — N200 Calculus of kidney: Secondary | ICD-10-CM | POA: Diagnosis not present

## 2021-06-13 ENCOUNTER — Ambulatory Visit
Admission: RE | Admit: 2021-06-13 | Discharge: 2021-06-13 | Disposition: A | Discharge status: Home / Self Care | Payer: BC Managed Care – PPO | Source: Ambulatory Visit | Attending: Obstetrics and Gynecology | Admitting: Obstetrics and Gynecology

## 2021-06-13 ENCOUNTER — Other Ambulatory Visit: Payer: Self-pay | Admitting: Obstetrics and Gynecology

## 2021-06-13 DIAGNOSIS — R928 Other abnormal and inconclusive findings on diagnostic imaging of breast: Secondary | ICD-10-CM

## 2021-06-13 DIAGNOSIS — N6314 Unspecified lump in the right breast, lower inner quadrant: Secondary | ICD-10-CM | POA: Diagnosis not present

## 2021-06-13 DIAGNOSIS — N6312 Unspecified lump in the right breast, upper inner quadrant: Secondary | ICD-10-CM | POA: Diagnosis not present

## 2021-12-06 DIAGNOSIS — M5442 Lumbago with sciatica, left side: Secondary | ICD-10-CM | POA: Diagnosis not present

## 2021-12-06 DIAGNOSIS — M62830 Muscle spasm of back: Secondary | ICD-10-CM | POA: Diagnosis not present

## 2021-12-06 DIAGNOSIS — M546 Pain in thoracic spine: Secondary | ICD-10-CM | POA: Diagnosis not present

## 2021-12-11 DIAGNOSIS — M62830 Muscle spasm of back: Secondary | ICD-10-CM | POA: Diagnosis not present

## 2021-12-11 DIAGNOSIS — M5442 Lumbago with sciatica, left side: Secondary | ICD-10-CM | POA: Diagnosis not present

## 2021-12-11 DIAGNOSIS — M546 Pain in thoracic spine: Secondary | ICD-10-CM | POA: Diagnosis not present

## 2021-12-12 DIAGNOSIS — M546 Pain in thoracic spine: Secondary | ICD-10-CM | POA: Diagnosis not present

## 2021-12-12 DIAGNOSIS — M5442 Lumbago with sciatica, left side: Secondary | ICD-10-CM | POA: Diagnosis not present

## 2021-12-12 DIAGNOSIS — M62830 Muscle spasm of back: Secondary | ICD-10-CM | POA: Diagnosis not present

## 2021-12-13 DIAGNOSIS — M5442 Lumbago with sciatica, left side: Secondary | ICD-10-CM | POA: Diagnosis not present

## 2021-12-13 DIAGNOSIS — M62830 Muscle spasm of back: Secondary | ICD-10-CM | POA: Diagnosis not present

## 2021-12-13 DIAGNOSIS — M546 Pain in thoracic spine: Secondary | ICD-10-CM | POA: Diagnosis not present

## 2021-12-18 DIAGNOSIS — M62830 Muscle spasm of back: Secondary | ICD-10-CM | POA: Diagnosis not present

## 2021-12-18 DIAGNOSIS — M5442 Lumbago with sciatica, left side: Secondary | ICD-10-CM | POA: Diagnosis not present

## 2021-12-18 DIAGNOSIS — M546 Pain in thoracic spine: Secondary | ICD-10-CM | POA: Diagnosis not present

## 2021-12-19 ENCOUNTER — Ambulatory Visit
Admission: RE | Admit: 2021-12-19 | Discharge: 2021-12-19 | Disposition: A | Discharge status: Home / Self Care | Payer: BC Managed Care – PPO | Source: Ambulatory Visit | Attending: Obstetrics and Gynecology | Admitting: Obstetrics and Gynecology

## 2021-12-19 DIAGNOSIS — R928 Other abnormal and inconclusive findings on diagnostic imaging of breast: Secondary | ICD-10-CM

## 2021-12-19 DIAGNOSIS — N6001 Solitary cyst of right breast: Secondary | ICD-10-CM | POA: Diagnosis not present

## 2021-12-19 DIAGNOSIS — N6002 Solitary cyst of left breast: Secondary | ICD-10-CM | POA: Diagnosis not present

## 2021-12-20 DIAGNOSIS — M546 Pain in thoracic spine: Secondary | ICD-10-CM | POA: Diagnosis not present

## 2021-12-20 DIAGNOSIS — M5442 Lumbago with sciatica, left side: Secondary | ICD-10-CM | POA: Diagnosis not present

## 2021-12-20 DIAGNOSIS — M62830 Muscle spasm of back: Secondary | ICD-10-CM | POA: Diagnosis not present

## 2021-12-26 DIAGNOSIS — M62830 Muscle spasm of back: Secondary | ICD-10-CM | POA: Diagnosis not present

## 2021-12-26 DIAGNOSIS — M5442 Lumbago with sciatica, left side: Secondary | ICD-10-CM | POA: Diagnosis not present

## 2021-12-26 DIAGNOSIS — M546 Pain in thoracic spine: Secondary | ICD-10-CM | POA: Diagnosis not present

## 2021-12-27 DIAGNOSIS — M546 Pain in thoracic spine: Secondary | ICD-10-CM | POA: Diagnosis not present

## 2021-12-27 DIAGNOSIS — M62830 Muscle spasm of back: Secondary | ICD-10-CM | POA: Diagnosis not present

## 2021-12-27 DIAGNOSIS — M5442 Lumbago with sciatica, left side: Secondary | ICD-10-CM | POA: Diagnosis not present

## 2022-01-01 DIAGNOSIS — M546 Pain in thoracic spine: Secondary | ICD-10-CM | POA: Diagnosis not present

## 2022-01-01 DIAGNOSIS — M5442 Lumbago with sciatica, left side: Secondary | ICD-10-CM | POA: Diagnosis not present

## 2022-01-01 DIAGNOSIS — M62830 Muscle spasm of back: Secondary | ICD-10-CM | POA: Diagnosis not present

## 2022-01-02 DIAGNOSIS — M546 Pain in thoracic spine: Secondary | ICD-10-CM | POA: Diagnosis not present

## 2022-01-02 DIAGNOSIS — M62830 Muscle spasm of back: Secondary | ICD-10-CM | POA: Diagnosis not present

## 2022-01-02 DIAGNOSIS — M5442 Lumbago with sciatica, left side: Secondary | ICD-10-CM | POA: Diagnosis not present

## 2022-01-03 DIAGNOSIS — M62830 Muscle spasm of back: Secondary | ICD-10-CM | POA: Diagnosis not present

## 2022-01-03 DIAGNOSIS — M546 Pain in thoracic spine: Secondary | ICD-10-CM | POA: Diagnosis not present

## 2022-01-03 DIAGNOSIS — M5442 Lumbago with sciatica, left side: Secondary | ICD-10-CM | POA: Diagnosis not present

## 2022-01-08 DIAGNOSIS — M546 Pain in thoracic spine: Secondary | ICD-10-CM | POA: Diagnosis not present

## 2022-01-08 DIAGNOSIS — M62830 Muscle spasm of back: Secondary | ICD-10-CM | POA: Diagnosis not present

## 2022-01-08 DIAGNOSIS — M5442 Lumbago with sciatica, left side: Secondary | ICD-10-CM | POA: Diagnosis not present

## 2022-01-09 DIAGNOSIS — M546 Pain in thoracic spine: Secondary | ICD-10-CM | POA: Diagnosis not present

## 2022-01-09 DIAGNOSIS — M62830 Muscle spasm of back: Secondary | ICD-10-CM | POA: Diagnosis not present

## 2022-01-09 DIAGNOSIS — M5442 Lumbago with sciatica, left side: Secondary | ICD-10-CM | POA: Diagnosis not present

## 2022-01-10 DIAGNOSIS — M546 Pain in thoracic spine: Secondary | ICD-10-CM | POA: Diagnosis not present

## 2022-01-10 DIAGNOSIS — M62830 Muscle spasm of back: Secondary | ICD-10-CM | POA: Diagnosis not present

## 2022-01-10 DIAGNOSIS — M5442 Lumbago with sciatica, left side: Secondary | ICD-10-CM | POA: Diagnosis not present

## 2022-01-16 DIAGNOSIS — M62830 Muscle spasm of back: Secondary | ICD-10-CM | POA: Diagnosis not present

## 2022-01-16 DIAGNOSIS — M546 Pain in thoracic spine: Secondary | ICD-10-CM | POA: Diagnosis not present

## 2022-01-16 DIAGNOSIS — M5442 Lumbago with sciatica, left side: Secondary | ICD-10-CM | POA: Diagnosis not present

## 2022-01-17 DIAGNOSIS — M62830 Muscle spasm of back: Secondary | ICD-10-CM | POA: Diagnosis not present

## 2022-01-17 DIAGNOSIS — M546 Pain in thoracic spine: Secondary | ICD-10-CM | POA: Diagnosis not present

## 2022-01-17 DIAGNOSIS — M5442 Lumbago with sciatica, left side: Secondary | ICD-10-CM | POA: Diagnosis not present

## 2022-01-30 DIAGNOSIS — K5900 Constipation, unspecified: Secondary | ICD-10-CM | POA: Diagnosis not present

## 2022-01-30 DIAGNOSIS — R5383 Other fatigue: Secondary | ICD-10-CM | POA: Diagnosis not present

## 2022-02-14 DIAGNOSIS — M5442 Lumbago with sciatica, left side: Secondary | ICD-10-CM | POA: Diagnosis not present

## 2022-02-14 DIAGNOSIS — M546 Pain in thoracic spine: Secondary | ICD-10-CM | POA: Diagnosis not present

## 2022-02-14 DIAGNOSIS — M62830 Muscle spasm of back: Secondary | ICD-10-CM | POA: Diagnosis not present

## 2022-06-20 DIAGNOSIS — M546 Pain in thoracic spine: Secondary | ICD-10-CM | POA: Diagnosis not present

## 2022-06-20 DIAGNOSIS — M62830 Muscle spasm of back: Secondary | ICD-10-CM | POA: Diagnosis not present

## 2022-06-20 DIAGNOSIS — M5442 Lumbago with sciatica, left side: Secondary | ICD-10-CM | POA: Diagnosis not present

## 2022-06-28 DIAGNOSIS — J019 Acute sinusitis, unspecified: Secondary | ICD-10-CM | POA: Diagnosis not present

## 2022-06-28 DIAGNOSIS — J208 Acute bronchitis due to other specified organisms: Secondary | ICD-10-CM | POA: Diagnosis not present

## 2022-09-12 DIAGNOSIS — Z6839 Body mass index (BMI) 39.0-39.9, adult: Secondary | ICD-10-CM | POA: Diagnosis not present

## 2022-09-12 DIAGNOSIS — Z124 Encounter for screening for malignant neoplasm of cervix: Secondary | ICD-10-CM | POA: Diagnosis not present

## 2022-09-12 DIAGNOSIS — Z01419 Encounter for gynecological examination (general) (routine) without abnormal findings: Secondary | ICD-10-CM | POA: Diagnosis not present

## 2022-09-12 DIAGNOSIS — Z1151 Encounter for screening for human papillomavirus (HPV): Secondary | ICD-10-CM | POA: Diagnosis not present

## 2022-11-21 ENCOUNTER — Other Ambulatory Visit: Payer: Self-pay | Admitting: Obstetrics and Gynecology

## 2022-11-21 DIAGNOSIS — N649 Disorder of breast, unspecified: Secondary | ICD-10-CM

## 2022-11-25 DIAGNOSIS — M26621 Arthralgia of right temporomandibular joint: Secondary | ICD-10-CM | POA: Diagnosis not present

## 2022-11-25 DIAGNOSIS — H6691 Otitis media, unspecified, right ear: Secondary | ICD-10-CM | POA: Diagnosis not present

## 2022-11-25 DIAGNOSIS — Z6841 Body Mass Index (BMI) 40.0 and over, adult: Secondary | ICD-10-CM | POA: Diagnosis not present

## 2022-12-23 ENCOUNTER — Ambulatory Visit
Admission: RE | Admit: 2022-12-23 | Discharge: 2022-12-23 | Disposition: A | Discharge status: Home / Self Care | Payer: BC Managed Care – PPO | Source: Ambulatory Visit | Attending: Obstetrics and Gynecology | Admitting: Obstetrics and Gynecology

## 2022-12-23 DIAGNOSIS — N649 Disorder of breast, unspecified: Secondary | ICD-10-CM

## 2022-12-23 DIAGNOSIS — N6341 Unspecified lump in right breast, subareolar: Secondary | ICD-10-CM | POA: Diagnosis not present

## 2023-01-16 DIAGNOSIS — Z13228 Encounter for screening for other metabolic disorders: Secondary | ICD-10-CM | POA: Diagnosis not present

## 2023-01-16 DIAGNOSIS — Z131 Encounter for screening for diabetes mellitus: Secondary | ICD-10-CM | POA: Diagnosis not present

## 2023-01-16 DIAGNOSIS — Z13220 Encounter for screening for lipoid disorders: Secondary | ICD-10-CM | POA: Diagnosis not present

## 2023-01-23 DIAGNOSIS — I10 Essential (primary) hypertension: Secondary | ICD-10-CM | POA: Diagnosis not present

## 2023-02-07 DIAGNOSIS — Z13220 Encounter for screening for lipoid disorders: Secondary | ICD-10-CM | POA: Diagnosis not present

## 2023-02-07 DIAGNOSIS — Z1331 Encounter for screening for depression: Secondary | ICD-10-CM | POA: Diagnosis not present

## 2023-02-07 DIAGNOSIS — I10 Essential (primary) hypertension: Secondary | ICD-10-CM | POA: Diagnosis not present

## 2023-02-21 DIAGNOSIS — J208 Acute bronchitis due to other specified organisms: Secondary | ICD-10-CM | POA: Diagnosis not present

## 2023-02-21 DIAGNOSIS — B9689 Other specified bacterial agents as the cause of diseases classified elsewhere: Secondary | ICD-10-CM | POA: Diagnosis not present

## 2023-02-21 DIAGNOSIS — R0981 Nasal congestion: Secondary | ICD-10-CM | POA: Diagnosis not present

## 2023-02-21 DIAGNOSIS — U071 COVID-19: Secondary | ICD-10-CM | POA: Diagnosis not present

## 2023-03-11 DIAGNOSIS — I10 Essential (primary) hypertension: Secondary | ICD-10-CM | POA: Diagnosis not present

## 2023-04-16 DIAGNOSIS — Z6841 Body Mass Index (BMI) 40.0 and over, adult: Secondary | ICD-10-CM | POA: Diagnosis not present

## 2023-04-16 DIAGNOSIS — Z713 Dietary counseling and surveillance: Secondary | ICD-10-CM | POA: Diagnosis not present

## 2023-05-10 IMAGING — MG DIGITAL DIAGNOSTIC BILAT W/ TOMO W/ CAD
8 series · 8 of 24 positions shown · non-contrast
Comparison: Previous exam(s).

CLINICAL DATA: Patient returns after screening study for evaluation
possible RIGHT breast asymmetry and possible LEFT breast mass.

EXAM:
DIGITAL DIAGNOSTIC BILATERAL MAMMOGRAM WITH TOMOSYNTHESIS AND CAD;
ULTRASOUND LEFT BREAST LIMITED; ULTRASOUND RIGHT BREAST LIMITED
TECHNIQUE: Bilateral digital diagnostic mammography and breast tomosynthesis
was performed. The images were evaluated with computer-aided
detection.; Targeted ultrasound examination of the left breast was
performed; Targeted ultrasound examination of the right breast was
performed

[R ML synth-2D]
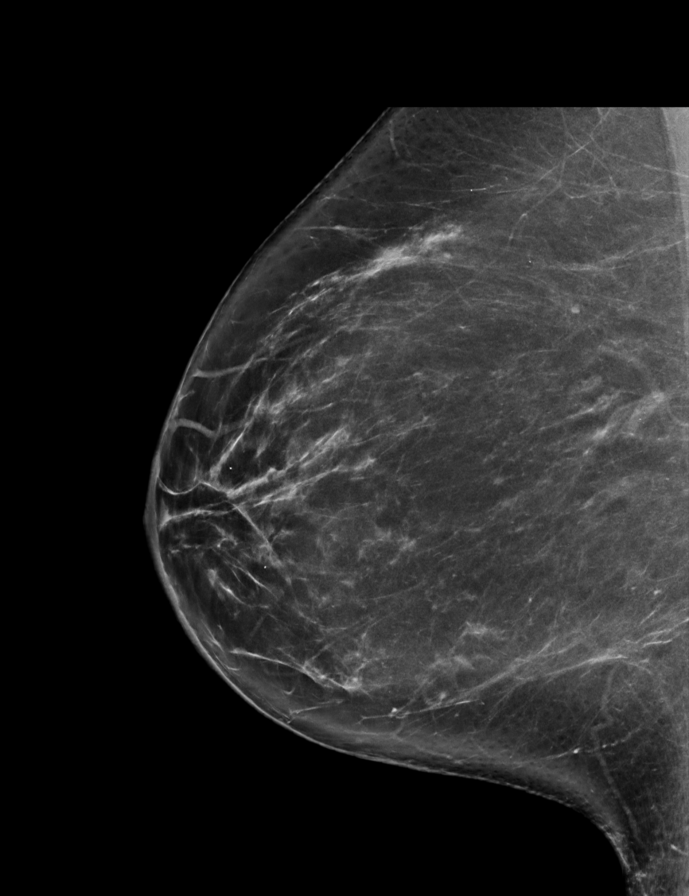

[L CC synth-2D]
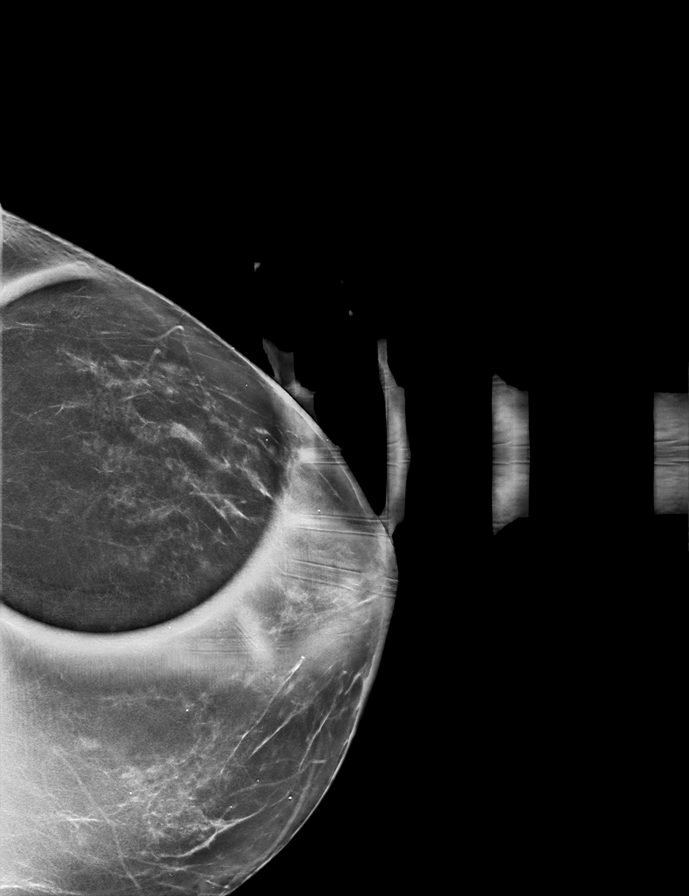

[L MLO synth-2D]
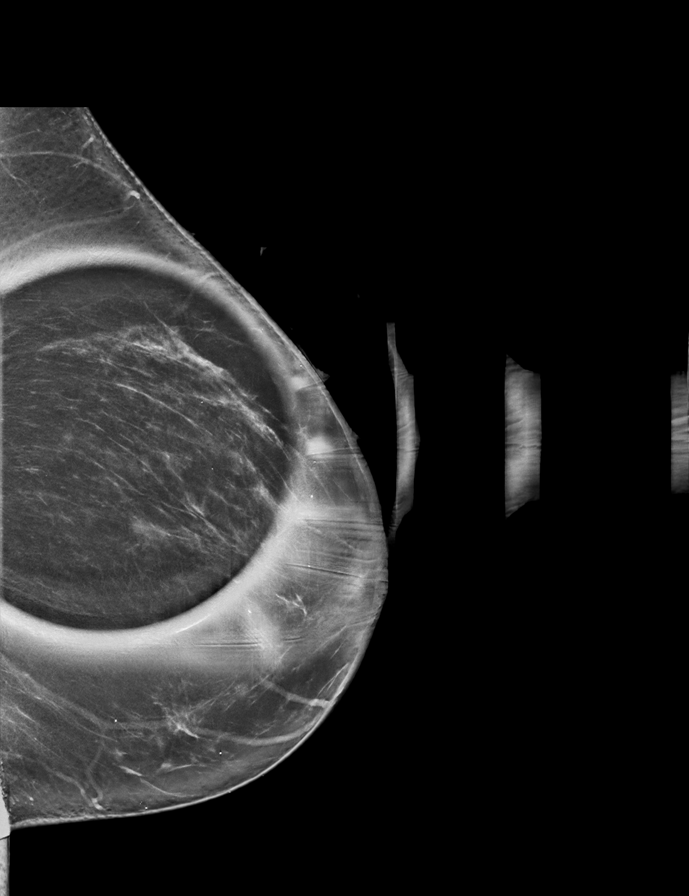

[R CC synth-2D]
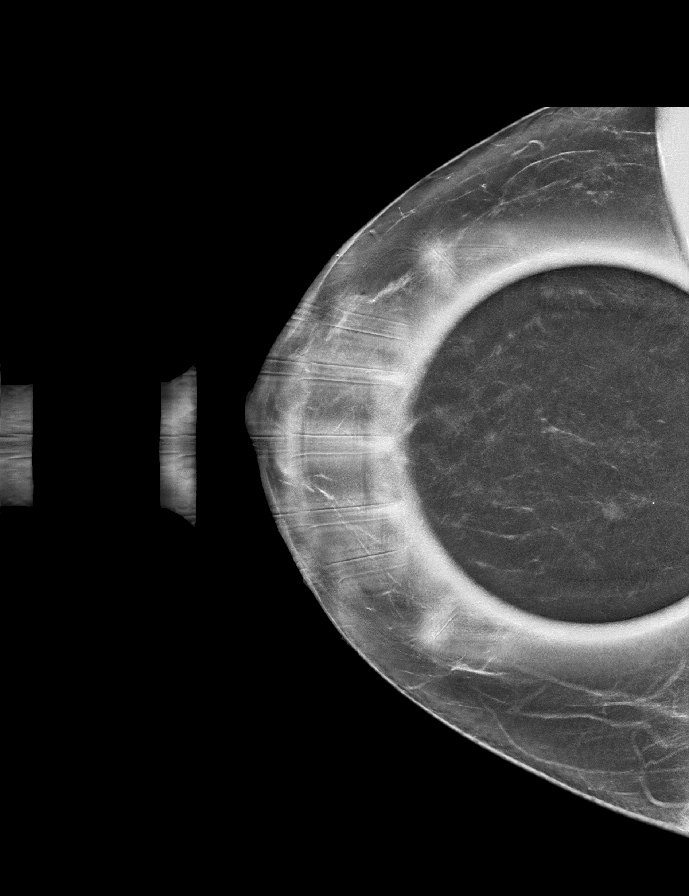

[L MLO tomo · tomo slice 40/79.0]
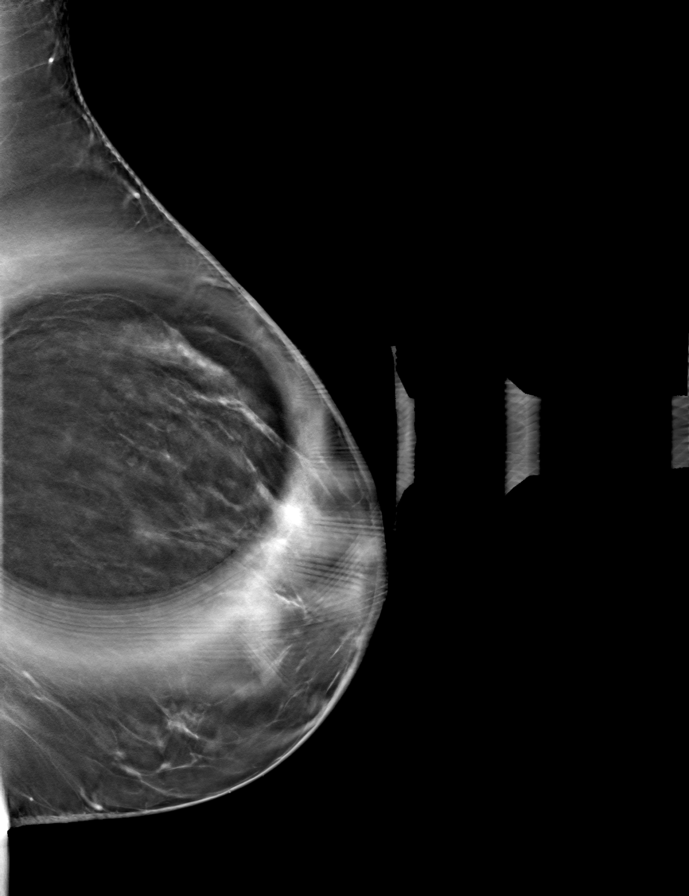

[L CC tomo · tomo slice 33/66.0]
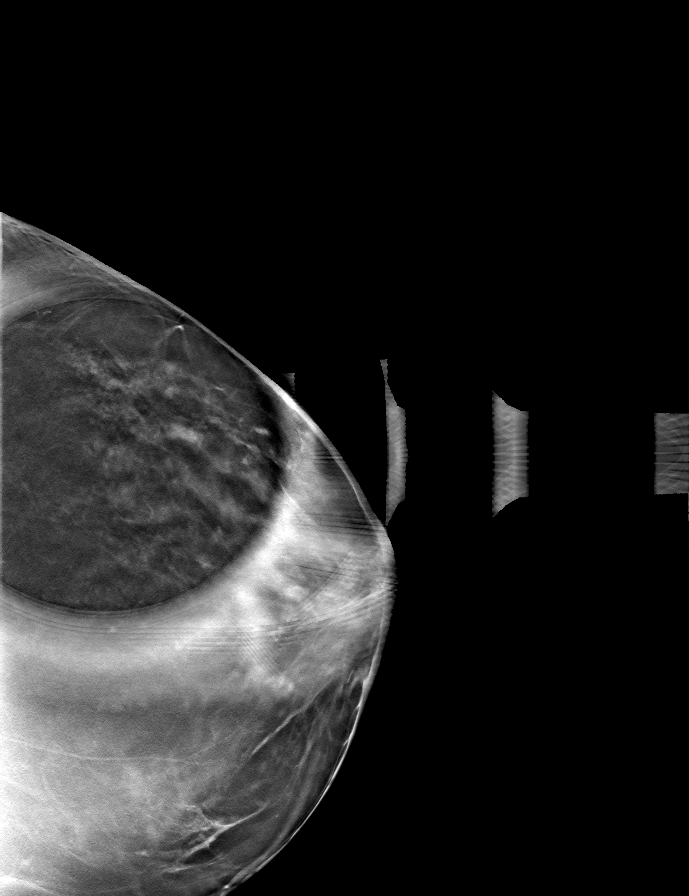

[R ML tomo · tomo slice 42/83.0]
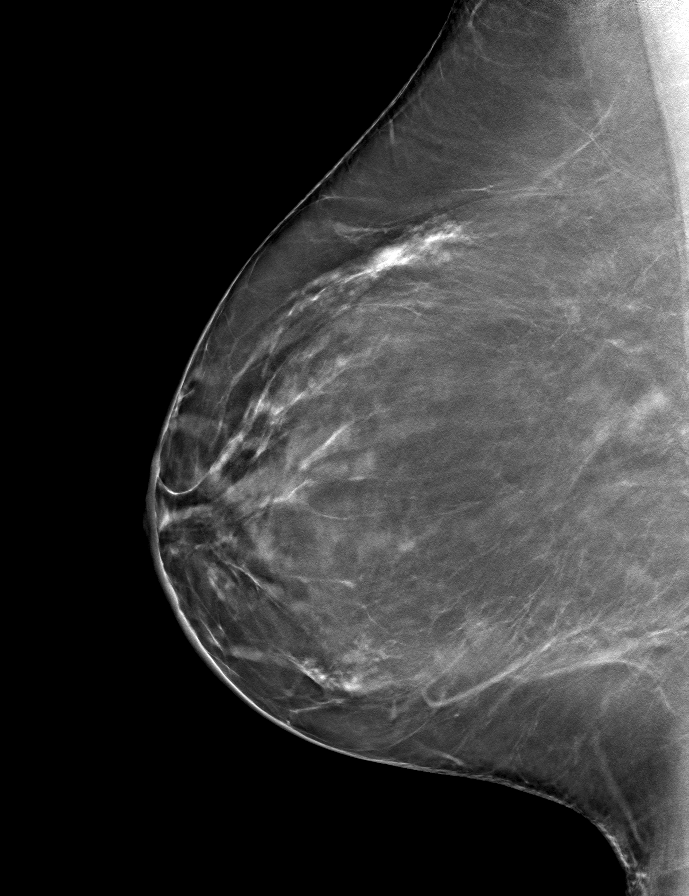

[R CC tomo · tomo slice 35/70.0]
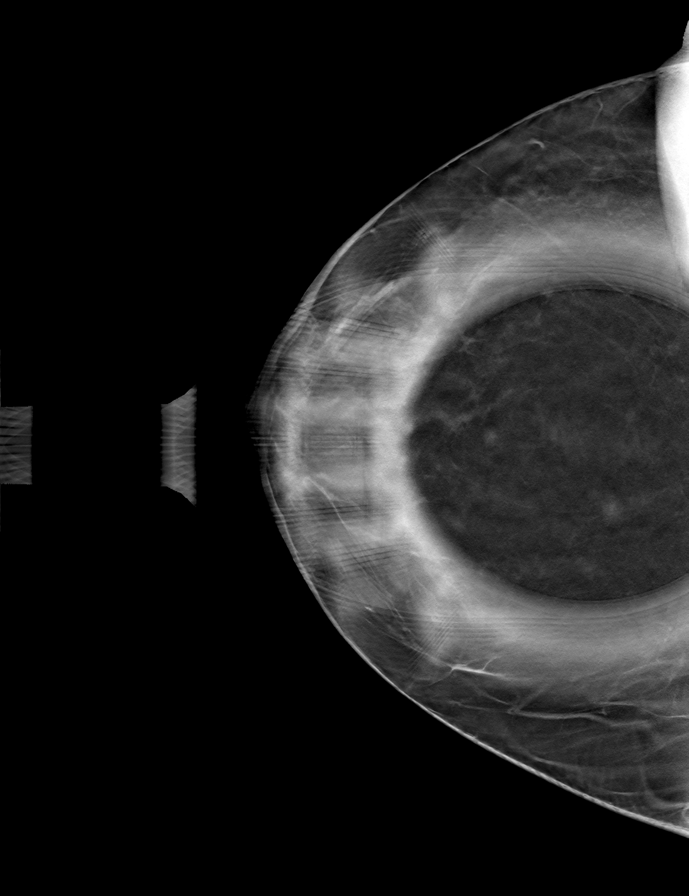

[8 of 24 positions shown; findings below may reference images not displayed]

ACR Breast Density Category b: There are scattered areas of
fibroglandular density.
FINDINGS: RIGHT BREAST:

Mammogram: Additional 2-D and 3-D images are performed. These views
for presence of a circumscribed oval mass in the posterior UPPER
INNER QUADRANT of the RIGHT breast. Mammographic images were
processed with CAD.

Ultrasound: Targeted ultrasound is performed, showing a simple cyst
in the 1 o'clock location of the RIGHT breast 2 centimeters from the
nipple which measures 0.5 x 0.2 x 0.4 centimeters. In the 3 o'clock
retroareolar location, a group of microcysts is 0.6 x 0.5 x
centimeters. There is no associated solid component or internal
blood flow.

LEFT BREAST:

Mammogram: Additional 2-D and 3-D images are performed. These views
confirm presence of a circumscribed oval mass in the LOWER OUTER
QUADRANT of the LEFT breast. Mammographic images were processed with
CAD.

Ultrasound: Targeted ultrasound is performed, showing a simple cyst
in the 4 o'clock location of the LEFT breast 2 centimeters from the
nipple which measures 0.4 x 0.4 x 0.3 centimeters.
IMPRESSION: 1. Bilateral simple cysts.
2. Group of microcysts in the 3 o'clock retroareolar region of the
RIGHT breast warranting follow-up.

RECOMMENDATION:
Recommend RIGHT breast ultrasound in 6 months.

I have discussed the findings and recommendations with the patient.
If applicable, a reminder letter will be sent to the patient
regarding the next appointment.

BI-RADS CATEGORY  3: Probably benign.

## 2023-09-18 ENCOUNTER — Other Ambulatory Visit: Payer: Self-pay | Admitting: Obstetrics and Gynecology

## 2023-09-18 DIAGNOSIS — Z1231 Encounter for screening mammogram for malignant neoplasm of breast: Secondary | ICD-10-CM

## 2023-11-01 LAB — COLOGUARD: COLOGUARD: NEGATIVE

## 2023-12-25 ENCOUNTER — Ambulatory Visit
Admission: RE | Admit: 2023-12-25 | Discharge: 2023-12-25 | Disposition: A | Discharge status: Home / Self Care | Source: Ambulatory Visit | Attending: Obstetrics and Gynecology | Admitting: Obstetrics and Gynecology

## 2023-12-25 DIAGNOSIS — Z1231 Encounter for screening mammogram for malignant neoplasm of breast: Secondary | ICD-10-CM

## 2023-12-31 ENCOUNTER — Other Ambulatory Visit: Payer: Self-pay | Admitting: Obstetrics and Gynecology

## 2023-12-31 DIAGNOSIS — R921 Mammographic calcification found on diagnostic imaging of breast: Secondary | ICD-10-CM

## 2024-01-01 ENCOUNTER — Other Ambulatory Visit: Payer: Self-pay | Admitting: Obstetrics and Gynecology

## 2024-01-01 DIAGNOSIS — R928 Other abnormal and inconclusive findings on diagnostic imaging of breast: Secondary | ICD-10-CM

## 2024-01-02 ENCOUNTER — Encounter

## 2024-01-07 ENCOUNTER — Ambulatory Visit
Admission: RE | Admit: 2024-01-07 | Discharge: 2024-01-07 | Disposition: A | Discharge status: Home / Self Care | Source: Ambulatory Visit | Attending: Obstetrics and Gynecology | Admitting: Obstetrics and Gynecology

## 2024-01-07 ENCOUNTER — Encounter

## 2024-01-07 ENCOUNTER — Other Ambulatory Visit: Payer: Self-pay | Admitting: Obstetrics and Gynecology

## 2024-01-07 DIAGNOSIS — R928 Other abnormal and inconclusive findings on diagnostic imaging of breast: Secondary | ICD-10-CM

## 2024-01-07 DIAGNOSIS — N6489 Other specified disorders of breast: Secondary | ICD-10-CM

## 2024-07-15 ENCOUNTER — Other Ambulatory Visit: Payer: Self-pay | Admitting: Obstetrics and Gynecology

## 2024-07-15 DIAGNOSIS — N6489 Other specified disorders of breast: Secondary | ICD-10-CM

## 2024-07-19 ENCOUNTER — Encounter

## 2024-07-29 ENCOUNTER — Encounter

## 2024-08-16 ENCOUNTER — Encounter
